# Patient Record
Sex: Female | Born: 1955 | Race: White | Hispanic: No | Marital: Married | Smoking: Never smoker
Health system: Southern US, Community
[De-identification: ages and names within clinical notes are randomized; demographics above are authoritative.]

## PROBLEM LIST (undated history)

## (undated) DIAGNOSIS — T7840XA Allergy, unspecified, initial encounter: Secondary | ICD-10-CM

## (undated) DIAGNOSIS — M419 Scoliosis, unspecified: Secondary | ICD-10-CM

## (undated) DIAGNOSIS — I10 Essential (primary) hypertension: Secondary | ICD-10-CM

## (undated) HISTORY — DX: Scoliosis, unspecified: M41.9

## (undated) HISTORY — DX: Allergy, unspecified, initial encounter: T78.40XA

## (undated) HISTORY — PX: KNEE ARTHROSCOPY: SUR90

## (undated) HISTORY — DX: Essential (primary) hypertension: I10

---

## 2005-07-11 ENCOUNTER — Ambulatory Visit: Payer: Self-pay

## 2006-12-10 ENCOUNTER — Ambulatory Visit: Payer: Self-pay

## 2008-11-30 ENCOUNTER — Ambulatory Visit: Payer: Self-pay

## 2011-09-10 ENCOUNTER — Ambulatory Visit: Payer: Self-pay

## 2012-06-15 ENCOUNTER — Ambulatory Visit: Payer: Self-pay | Admitting: General Practice

## 2012-07-28 ENCOUNTER — Encounter: Payer: Self-pay | Admitting: Neurology

## 2012-08-24 ENCOUNTER — Encounter: Payer: Self-pay | Admitting: Neurology

## 2012-09-24 ENCOUNTER — Encounter: Payer: Self-pay | Admitting: Neurology

## 2013-01-31 ENCOUNTER — Ambulatory Visit: Payer: Self-pay | Admitting: Internal Medicine

## 2013-10-10 LAB — LIPID PANEL
Cholesterol: 204 mg/dL — AB (ref 0–200)
HDL: 65 mg/dL (ref 35–70)
LDL CALC: 81 mg/dL
Triglycerides: 289 mg/dL — AB (ref 40–160)

## 2013-10-10 LAB — BASIC METABOLIC PANEL
BUN: 19 mg/dL (ref 4–21)
Creatinine: 0.8 mg/dL (ref 0.5–1.1)

## 2013-10-10 LAB — TSH: TSH: 1.1 u[IU]/mL (ref 0.41–5.90)

## 2013-10-10 LAB — CBC AND DIFFERENTIAL: Hemoglobin: 14.2 g/dL (ref 12.0–16.0)

## 2014-04-17 ENCOUNTER — Ambulatory Visit: Payer: Self-pay | Admitting: Gastroenterology

## 2014-04-17 LAB — HM COLONOSCOPY

## 2014-05-26 ENCOUNTER — Ambulatory Visit
Admit: 2014-05-26 | Disposition: A | Discharge status: Home / Self Care | Payer: Self-pay | Attending: Internal Medicine | Admitting: Internal Medicine

## 2014-05-26 LAB — HM MAMMOGRAPHY

## 2014-06-07 ENCOUNTER — Ambulatory Visit
Admit: 2014-06-07 | Disposition: A | Discharge status: Home / Self Care | Payer: Self-pay | Attending: Internal Medicine | Admitting: Internal Medicine

## 2015-02-03 ENCOUNTER — Encounter: Payer: Self-pay | Admitting: Internal Medicine

## 2015-02-03 ENCOUNTER — Other Ambulatory Visit: Payer: Self-pay | Admitting: Internal Medicine

## 2015-02-03 DIAGNOSIS — K219 Gastro-esophageal reflux disease without esophagitis: Secondary | ICD-10-CM | POA: Insufficient documentation

## 2015-02-03 DIAGNOSIS — I10 Essential (primary) hypertension: Secondary | ICD-10-CM | POA: Insufficient documentation

## 2015-02-03 DIAGNOSIS — M4802 Spinal stenosis, cervical region: Secondary | ICD-10-CM | POA: Insufficient documentation

## 2015-02-03 DIAGNOSIS — G250 Essential tremor: Secondary | ICD-10-CM | POA: Insufficient documentation

## 2015-02-03 DIAGNOSIS — E785 Hyperlipidemia, unspecified: Secondary | ICD-10-CM | POA: Insufficient documentation

## 2015-06-28 ENCOUNTER — Other Ambulatory Visit: Payer: Self-pay | Admitting: Internal Medicine

## 2015-07-06 NOTE — Telephone Encounter (Signed)
pts gonna call back to make this appt

## 2015-10-05 ENCOUNTER — Ambulatory Visit (INDEPENDENT_AMBULATORY_CARE_PROVIDER_SITE_OTHER): Payer: 59 | Admitting: Internal Medicine

## 2015-10-05 ENCOUNTER — Encounter: Payer: Self-pay | Admitting: Internal Medicine

## 2015-10-05 VITALS — BP 122/78 | HR 74 | Resp 16 | Ht 65.0 in | Wt 121.0 lb

## 2015-10-05 DIAGNOSIS — Z1239 Encounter for other screening for malignant neoplasm of breast: Secondary | ICD-10-CM | POA: Diagnosis not present

## 2015-10-05 DIAGNOSIS — I10 Essential (primary) hypertension: Secondary | ICD-10-CM

## 2015-10-05 DIAGNOSIS — M25551 Pain in right hip: Secondary | ICD-10-CM | POA: Diagnosis not present

## 2015-10-05 MED ORDER — IRBESARTAN 150 MG PO TABS
150.0000 mg | ORAL_TABLET | Freq: Every day | ORAL | 3 refills | Status: DC
Start: 2015-10-05 — End: 2016-10-15

## 2015-10-05 NOTE — Progress Notes (Signed)
Date:  10/05/2015   Name:  Wanda Moody   DOB:  May 10, 1955   MRN:  [id number]   Chief Complaint: Hypertension and Back Pain Hypertension  This is a chronic problem. The current episode started more than 1 year ago. The problem is unchanged. The problem is controlled. Pertinent negatives include no chest pain, palpitations or shortness of breath.  Back Pain  This is a chronic problem. The current episode started more than 1 year ago. The problem occurs every several days. The problem has been gradually worsening since onset. The pain is present in the sacro-iliac. The pain radiates to the right thigh. The pain is mild. Pertinent negatives include no abdominal pain, bladder incontinence, bowel incontinence, chest pain, numbness, paresthesias, perianal numbness, tingling or weakness. Risk factors: scoliosis. She has tried chiropractic manipulation and analgesics for the symptoms. The treatment provided mild relief.   HM - due for mammogram. She denies any breast problems.  No history of breast cancer in family.  Colonoscopy done in 2016.  Pap done in 2016.   Review of Systems  Constitutional: Negative for chills, fatigue and unexpected weight change.  Respiratory: Negative for cough, chest tightness and shortness of breath.   Cardiovascular: Negative for chest pain, palpitations and leg swelling.  Gastrointestinal: Negative for abdominal pain and bowel incontinence.  Genitourinary: Negative for bladder incontinence and difficulty urinating.  Musculoskeletal: Positive for back pain and gait problem.  Skin: Negative for rash.  Neurological: Negative for dizziness, tingling, weakness, numbness and paresthesias.  Psychiatric/Behavioral: Negative for dysphoric mood and sleep disturbance.    Patient Active Problem List   Diagnosis Date Noted  . Essential (primary) hypertension 02/03/2015  . Gastro-esophageal reflux disease without esophagitis 02/03/2015  . HLD (hyperlipidemia) 02/03/2015    . Menopause 02/03/2015  . Cervical spinal stenosis 02/03/2015  . Benign essential tremor 02/03/2015    Prior to Admission medications   Medication Sig Start Date End Date Taking? Authorizing Provider  calcium carbonate (OS-CAL - DOSED IN MG OF ELEMENTAL CALCIUM) 1250 (500 Ca) MG tablet Take by mouth.   Yes Historical Provider, MD  Cholecalciferol (VITAMIN D) 2000 UNITS CAPS Take 1 capsule by mouth daily.   Yes Historical Provider, MD  estrogen, conjugated,-medroxyprogesterone (PREMPRO) 0.3-1.5 MG tablet Take 1 tablet by mouth daily. 10/10/13  Yes Historical Provider, MD  irbesartan (AVAPRO) 150 MG tablet TAKE 1 TABLET BY MOUTH DAILY 06/28/15  Yes Reubin Milan, MD  loratadine (CLARITIN) 10 MG tablet Take 10 mg by mouth daily.   Yes Historical Provider, MD  MULTIPLE VITAMIN PO Take by mouth.   Yes Historical Provider, MD  omeprazole (PRILOSEC OTC) 20 MG tablet Take 1 tablet by mouth daily.   Yes Historical Provider, MD    No Known Allergies  Past Surgical History:  Procedure Laterality Date  . KNEE ARTHROSCOPY      Social History  Substance Use Topics  . Smoking status: Never Smoker  . Smokeless tobacco: Never Used  . Alcohol use 1.8 oz/week    2 Standard drinks or equivalent, 1 Glasses of wine per week     Medication list has been reviewed and updated.   Physical Exam  Constitutional: She is oriented to person, place, and time. She appears well-developed. No distress.  HENT:  Head: Normocephalic and atraumatic.  Neck: Normal range of motion. Neck supple. Carotid bruit is not present. No thyromegaly present.  Cardiovascular: Normal rate, regular rhythm and normal heart sounds.   Pulmonary/Chest: Effort normal and breath  sounds normal. No respiratory distress.  Musculoskeletal:       Right hip: She exhibits decreased range of motion. She exhibits no tenderness.       Left hip: Normal.       Lumbar back: She exhibits tenderness. She exhibits no spasm.  Lymphadenopathy:     She has no cervical adenopathy.  Neurological: She is alert and oriented to person, place, and time. She has normal strength and normal reflexes. No sensory deficit.  Skin: Skin is warm and dry. No rash noted.  Psychiatric: She has a normal mood and affect. Her behavior is normal. Thought content normal.  Nursing note and vitals reviewed.   BP 122/78 (BP Location: Right Arm, Patient Position: Sitting, Cuff Size: Normal)   Pulse 74   Resp 16   Ht 5\' 5"  (1.651 m)   Wt 121 lb (54.9 kg)   SpO2 100%   BMI 20.14 kg/m   Assessment and Plan: 1. Essential (primary) hypertension controlled - CBC with Differential/Platelet - Comprehensive metabolic panel - TSH - irbesartan (AVAPRO) 150 MG tablet; Take 1 tablet (150 mg total) by mouth daily.  Dispense: 90 tablet; Refill: 3  2. Breast cancer screening - MM DIGITAL SCREENING BILATERAL; Future  3. Right hip pain Continue tylenol - refer  - Ambulatory referral to Orthopedic Surgery   Bari EdwardLaura Alissa Pharr, MD [facility]  10/05/2015

## 2015-10-06 LAB — TSH: TSH: 1.04 u[IU]/mL (ref 0.450–4.500)

## 2015-10-06 LAB — CBC WITH DIFFERENTIAL/PLATELET
BASOS ABS: 0 10*3/uL (ref 0.0–0.2)
BASOS: 1 %
EOS (ABSOLUTE): 0.1 10*3/uL (ref 0.0–0.4)
Eos: 3 %
HEMOGLOBIN: 13.7 g/dL (ref 11.1–15.9)
Hematocrit: 41.6 % (ref 34.0–46.6)
IMMATURE GRANS (ABS): 0 10*3/uL (ref 0.0–0.1)
IMMATURE GRANULOCYTES: 0 %
LYMPHS: 22 %
Lymphocytes Absolute: 1 10*3/uL (ref 0.7–3.1)
MCH: 31.4 pg (ref 26.6–33.0)
MCHC: 32.9 g/dL (ref 31.5–35.7)
MCV: 95 fL (ref 79–97)
MONOCYTES: 7 %
Monocytes Absolute: 0.3 10*3/uL (ref 0.1–0.9)
NEUTROS PCT: 67 %
Neutrophils Absolute: 2.9 10*3/uL (ref 1.4–7.0)
PLATELETS: 225 10*3/uL (ref 150–379)
RBC: 4.36 x10E6/uL (ref 3.77–5.28)
RDW: 13.3 % (ref 12.3–15.4)
WBC: 4.4 10*3/uL (ref 3.4–10.8)

## 2015-10-06 LAB — COMPREHENSIVE METABOLIC PANEL
ALBUMIN: 4.9 g/dL — AB (ref 3.6–4.8)
ALT: 18 IU/L (ref 0–32)
AST: 20 IU/L (ref 0–40)
Albumin/Globulin Ratio: 2.1 (ref 1.2–2.2)
Alkaline Phosphatase: 43 IU/L (ref 39–117)
BUN/Creatinine Ratio: 29 — ABNORMAL HIGH (ref 12–28)
BUN: 22 mg/dL (ref 8–27)
Bilirubin Total: 0.6 mg/dL (ref 0.0–1.2)
CALCIUM: 9.9 mg/dL (ref 8.7–10.3)
CO2: 22 mmol/L (ref 18–29)
CREATININE: 0.77 mg/dL (ref 0.57–1.00)
Chloride: 99 mmol/L (ref 96–106)
GFR, EST AFRICAN AMERICAN: 96 mL/min/{1.73_m2} (ref >59)
GFR, EST NON AFRICAN AMERICAN: 84 mL/min/{1.73_m2} (ref >59)
GLUCOSE: 78 mg/dL (ref 65–99)
Globulin, Total: 2.3 g/dL (ref 1.5–4.5)
Potassium: 4.6 mmol/L (ref 3.5–5.2)
Sodium: 139 mmol/L (ref 134–144)
TOTAL PROTEIN: 7.2 g/dL (ref 6.0–8.5)

## 2015-10-09 DIAGNOSIS — G8311 Monoplegia of lower limb affecting right dominant side: Secondary | ICD-10-CM | POA: Diagnosis not present

## 2015-10-24 DIAGNOSIS — G8311 Monoplegia of lower limb affecting right dominant side: Secondary | ICD-10-CM | POA: Diagnosis not present

## 2015-10-26 DIAGNOSIS — M5136 Other intervertebral disc degeneration, lumbar region: Secondary | ICD-10-CM | POA: Diagnosis not present

## 2015-10-31 DIAGNOSIS — M5136 Other intervertebral disc degeneration, lumbar region: Secondary | ICD-10-CM | POA: Diagnosis not present

## 2015-11-05 DIAGNOSIS — M4806 Spinal stenosis, lumbar region: Secondary | ICD-10-CM | POA: Diagnosis not present

## 2015-11-16 DIAGNOSIS — M5136 Other intervertebral disc degeneration, lumbar region: Secondary | ICD-10-CM | POA: Diagnosis not present

## 2015-12-16 DIAGNOSIS — M5136 Other intervertebral disc degeneration, lumbar region: Secondary | ICD-10-CM | POA: Diagnosis not present

## 2016-01-03 ENCOUNTER — Other Ambulatory Visit: Payer: Self-pay | Admitting: Internal Medicine

## 2016-01-03 DIAGNOSIS — Z1231 Encounter for screening mammogram for malignant neoplasm of breast: Secondary | ICD-10-CM

## 2016-01-16 DIAGNOSIS — M5136 Other intervertebral disc degeneration, lumbar region: Secondary | ICD-10-CM | POA: Diagnosis not present

## 2016-01-31 ENCOUNTER — Ambulatory Visit
Admission: RE | Admit: 2016-01-31 | Discharge: 2016-01-31 | Disposition: A | Discharge status: Home / Self Care | Payer: 59 | Source: Ambulatory Visit | Attending: Internal Medicine | Admitting: Internal Medicine

## 2016-01-31 DIAGNOSIS — Z1231 Encounter for screening mammogram for malignant neoplasm of breast: Secondary | ICD-10-CM | POA: Insufficient documentation

## 2016-02-29 ENCOUNTER — Ambulatory Visit: Payer: Self-pay | Admitting: Physician Assistant

## 2016-02-29 ENCOUNTER — Encounter: Payer: Self-pay | Admitting: Physician Assistant

## 2016-02-29 VITALS — BP 110/70 | HR 84 | Temp 97.6°F

## 2016-02-29 DIAGNOSIS — H6982 Other specified disorders of Eustachian tube, left ear: Secondary | ICD-10-CM

## 2016-02-29 MED ORDER — FLUTICASONE PROPIONATE 50 MCG/ACT NA SUSP: 2.0000 | Freq: Every day | NASAL | 6 refills | Status: DC

## 2016-02-29 MED ORDER — PREDNISONE 10 MG PO TABS: 30.0000 mg | ORAL_TABLET | Freq: Every day | ORAL | 0 refills | Status: DC

## 2016-02-29 MED ORDER — AMOXICILLIN 875 MG PO TABS: 875.0000 mg | ORAL_TABLET | Freq: Two times a day (BID) | ORAL | 0 refills | Status: DC

## 2016-02-29 NOTE — Progress Notes (Signed)
S:  C/o swollen gland under left ear for 4 days, no drainage from ears, no fever/chills, no cough or congestion, some sinus pressure, remainder ros neg Using otc meds without relief  O:  Vitals wnl, nad, tms dull b/l, worse on left side, nasal mucosa swollen, throat wnl, neck supple no lymph, lungs c t a, cv rrr, neuro intact  A: acute eustachean tube dysfunction  P: flonase,  prednisone 30mg  qd x 3d, return if not improving in 3 to 5 days, return earlier if worsening, amoxil if not better in 2 - 3 days

## 2016-04-17 ENCOUNTER — Encounter: Payer: Self-pay | Admitting: Internal Medicine

## 2016-04-17 ENCOUNTER — Ambulatory Visit (INDEPENDENT_AMBULATORY_CARE_PROVIDER_SITE_OTHER): Payer: 59 | Admitting: Internal Medicine

## 2016-04-17 VITALS — BP 118/64 | HR 67 | Ht 65.0 in | Wt 122.8 lb

## 2016-04-17 DIAGNOSIS — I499 Cardiac arrhythmia, unspecified: Secondary | ICD-10-CM | POA: Diagnosis not present

## 2016-04-17 NOTE — Progress Notes (Signed)
Date:  04/17/2016   Name:  Wanda Moody   DOB:  02-22-1956   MRN:  [id number]   Chief Complaint: irregular heartbeat (lasted 3 days with shortness of breath. started 5 days ago.) Palpitations   This is a new problem. The current episode started in the past 7 days. The problem occurs 2 to 4 times per day. The problem has been gradually improving. On average, each episode lasts 5 seconds. Nothing aggravates the symptoms. Associated symptoms include an irregular heartbeat and shortness of breath. Pertinent negatives include no chest pain, coughing, dizziness, nausea, near-syncope or vomiting. She has tried nothing for the symptoms.    Review of Systems  Constitutional: Positive for fatigue. Negative for chills and unexpected weight change.  Eyes: Negative for visual disturbance.  Respiratory: Positive for shortness of breath. Negative for cough, chest tightness and wheezing.   Cardiovascular: Positive for palpitations. Negative for chest pain, leg swelling and near-syncope.  Gastrointestinal: Negative for abdominal pain, nausea and vomiting.  Endocrine: Negative for polyuria.  Neurological: Negative for dizziness, syncope and headaches.    Patient Active Problem List   Diagnosis Date Noted  . Right hip pain 10/05/2015  . Essential (primary) hypertension 02/03/2015  . Gastro-esophageal reflux disease without esophagitis 02/03/2015  . HLD (hyperlipidemia) 02/03/2015  . Menopause 02/03/2015  . Cervical spinal stenosis 02/03/2015  . Benign essential tremor 02/03/2015    Prior to Admission medications   Medication Sig Start Date End Date Taking? Authorizing Provider  calcium carbonate (OS-CAL - DOSED IN MG OF ELEMENTAL CALCIUM) 1250 (500 Ca) MG tablet Take by mouth.   Yes Historical Provider, MD  Cholecalciferol (VITAMIN D) 2000 UNITS CAPS Take 1 capsule by mouth daily.   Yes Historical Provider, MD  fluticasone (FLONASE) 50 MCG/ACT nasal spray Place 2 sprays into both nostrils  daily. 02/29/16  Yes Faythe Ghee, PA-C  loratadine (CLARITIN) 10 MG tablet Take 10 mg by mouth daily.   Yes Historical Provider, MD  valsartan (DIOVAN) 160 MG tablet Take by mouth.   Yes Historical Provider, MD  amoxicillin (AMOXIL) 875 MG tablet Take 1 tablet (875 mg total) by mouth 2 (two) times daily. Patient not taking: Reported on 04/17/2016 02/29/16   Faythe Ghee, PA-C  estrogen, conjugated,-medroxyprogesterone (PREMPRO) 0.3-1.5 MG tablet Take 1 tablet by mouth daily. 10/10/13   Historical Provider, MD  irbesartan (AVAPRO) 150 MG tablet Take 1 tablet (150 mg total) by mouth daily. Patient not taking: Reported on 02/29/2016 10/05/15   Reubin Milan, MD  MULTIPLE VITAMIN PO Take by mouth.    Historical Provider, MD  Omega-3 1000 MG CAPS Take 1,000 mg by mouth daily.    Historical Provider, MD  omeprazole (PRILOSEC OTC) 20 MG tablet Take 1 tablet by mouth daily.    Historical Provider, MD  predniSONE (DELTASONE) 10 MG tablet Take 3 tablets (30 mg total) by mouth daily with breakfast. Patient not taking: Reported on 04/17/2016 02/29/16   Faythe Ghee, PA-C    No Known Allergies  Past Surgical History:  Procedure Laterality Date  . KNEE ARTHROSCOPY      Social History  Substance Use Topics  . Smoking status: Never Smoker  . Smokeless tobacco: Never Used  . Alcohol use 1.8 oz/week    2 Standard drinks or equivalent, 1 Glasses of wine per week     Medication list has been reviewed and updated.   Physical Exam  Constitutional: She is oriented to person, place, and time. She appears  well-developed. No distress.  HENT:  Head: Normocephalic and atraumatic.  Neck: Normal range of motion. Neck supple. No tracheal tenderness present. Carotid bruit is not present.  Cardiovascular: Normal rate, regular rhythm and normal heart sounds.   Occasional extrasystoles are present.  Pulmonary/Chest: Effort normal and breath sounds normal. No respiratory distress. She has no wheezes. She has no  rhonchi.  Musculoskeletal: She exhibits no edema.  Neurological: She is alert and oriented to person, place, and time.  Skin: Skin is warm and dry. No rash noted.  Psychiatric: She has a normal mood and affect. Her behavior is normal. Thought content normal.  Nursing note and vitals reviewed.   BP 118/64   Pulse 67   Ht 5\' 5"  (1.651 m)   Wt 122 lb 12.8 oz (55.7 kg)   SpO2 100%   BMI 20.43 kg/m   Assessment and Plan: 1. Cardiac arrhythmia, unspecified cardiac arrhythmia type Suspect PAC/PVC but none seen on EKG Will place ZIO patch - EKG 12-Lead NSR @ 61, WNL   Bari EdwardLaura Tyshika Baldridge, MD [facility]  04/17/2016

## 2016-04-17 NOTE — Patient Instructions (Signed)
Return at 1:45 today for ZIO patch

## 2016-05-02 DIAGNOSIS — R002 Palpitations: Secondary | ICD-10-CM | POA: Diagnosis not present

## 2016-05-10 ENCOUNTER — Telehealth: Payer: Self-pay | Admitting: Internal Medicine

## 2016-05-12 ENCOUNTER — Telehealth: Payer: Self-pay

## 2016-05-12 ENCOUNTER — Encounter: Payer: Self-pay | Admitting: Internal Medicine

## 2016-05-12 NOTE — Telephone Encounter (Signed)
Called pt with Zio patch results.- Given to DelightNikki to scan into chart.

## 2016-05-12 NOTE — Telephone Encounter (Signed)
Patient notified of ZIO results.  No worrisome abnormality.  Only occasional PAC/PVC.

## 2016-06-09 ENCOUNTER — Telehealth: Payer: Self-pay

## 2016-06-09 NOTE — Telephone Encounter (Signed)
Patient has 2 medical eval charges as well as med exam EKG and was told to leave and come back after lunch so that they could see if she was approved for zio patch and was told that she was but then got bill stating she is out of network for zio. I do have a number that she can call for zio and they will help cover the amount but I have no idea about the rest of the bill for this DOS and she was billed over 900 when she never even has co pay here.

## 2016-06-09 NOTE — Telephone Encounter (Signed)
This was applied to deductible

## 2016-06-10 NOTE — Telephone Encounter (Signed)
LMTCB

## 2016-06-11 NOTE — Telephone Encounter (Signed)
Patient said she has NEVER had to pay office charges or deductible. She said she met 200.00 on co insurance and is very confused so I gave her Billing number.

## 2016-10-15 ENCOUNTER — Other Ambulatory Visit: Payer: Self-pay | Admitting: Internal Medicine

## 2016-10-15 DIAGNOSIS — I10 Essential (primary) hypertension: Secondary | ICD-10-CM

## 2016-10-15 NOTE — Telephone Encounter (Signed)
LVM to call back to schedule appt

## 2016-12-04 ENCOUNTER — Ambulatory Visit (INDEPENDENT_AMBULATORY_CARE_PROVIDER_SITE_OTHER): Payer: 59 | Admitting: Internal Medicine

## 2016-12-04 ENCOUNTER — Encounter: Payer: Self-pay | Admitting: Internal Medicine

## 2016-12-04 VITALS — BP 122/68 | HR 72 | Ht 65.0 in | Wt 124.0 lb

## 2016-12-04 DIAGNOSIS — Z1231 Encounter for screening mammogram for malignant neoplasm of breast: Secondary | ICD-10-CM

## 2016-12-04 DIAGNOSIS — E782 Mixed hyperlipidemia: Secondary | ICD-10-CM

## 2016-12-04 DIAGNOSIS — M1811 Unilateral primary osteoarthritis of first carpometacarpal joint, right hand: Secondary | ICD-10-CM | POA: Diagnosis not present

## 2016-12-04 DIAGNOSIS — M180 Bilateral primary osteoarthritis of first carpometacarpal joints: Secondary | ICD-10-CM

## 2016-12-04 DIAGNOSIS — I10 Essential (primary) hypertension: Secondary | ICD-10-CM

## 2016-12-04 DIAGNOSIS — M1812 Unilateral primary osteoarthritis of first carpometacarpal joint, left hand: Secondary | ICD-10-CM

## 2016-12-04 DIAGNOSIS — K219 Gastro-esophageal reflux disease without esophagitis: Secondary | ICD-10-CM

## 2016-12-04 DIAGNOSIS — Z1239 Encounter for other screening for malignant neoplasm of breast: Secondary | ICD-10-CM

## 2016-12-04 MED ORDER — IRBESARTAN 150 MG PO TABS: 150.0000 mg | ORAL_TABLET | Freq: Every day | ORAL | 3 refills | Status: DC

## 2016-12-04 NOTE — Progress Notes (Signed)
Date:  12/04/2016   Name:  Wanda Moody   DOB:  Aug 24, 1955   MRN:  [id number]   Chief Complaint: Hypertension Hypertension  This is a chronic problem. The problem is controlled. Pertinent negatives include no chest pain, headaches or shortness of breath. Past treatments include angiotensin blockers. The current treatment provides significant improvement.  Gastroesophageal Reflux  She reports no abdominal pain or no chest pain. This is a recurrent problem. The problem occurs occasionally. Pertinent negatives include no fatigue.  Hyperlipidemia  This is a chronic problem. Recent lipid tests were reviewed and are variable. Pertinent negatives include no chest pain or shortness of breath. Current antihyperlipidemic treatment includes diet change and exercise.  Thumb arthritis - stiffness and pain in both thumb joints. Is able to work and do what she needs to but is bothered on a daily basis.There is no numbess or significant loss of strength.She uses Advil as needed which has helped.    Review of Systems  Constitutional: Negative for chills, fatigue and unexpected weight change.  Respiratory: Negative for chest tightness and shortness of breath.   Cardiovascular: Negative for chest pain.  Gastrointestinal: Negative for abdominal pain.  Musculoskeletal: Positive for arthralgias (in both thumbs).  Skin: Negative for rash.  Neurological: Negative for dizziness, syncope, weakness and headaches.  Psychiatric/Behavioral: Negative for dysphoric mood and sleep disturbance. The patient is not nervous/anxious.     Patient Active Problem List   Diagnosis Date Noted  . Osteoarthritis of both thumbs 12/04/2016  . Right hip pain 10/05/2015  . Essential (primary) hypertension 02/03/2015  . Gastro-esophageal reflux disease without esophagitis 02/03/2015  . HLD (hyperlipidemia) 02/03/2015  . Menopause 02/03/2015  . Cervical spinal stenosis 02/03/2015  . Benign essential tremor 02/03/2015     Prior to Admission medications   Medication Sig Start Date End Date Taking? Authorizing Provider  calcium carbonate (OS-CAL - DOSED IN MG OF ELEMENTAL CALCIUM) 1250 (500 Ca) MG tablet Take by mouth.   Yes [provider]  Cholecalciferol (VITAMIN D) 2000 UNITS CAPS Take 1 capsule by mouth daily.   Yes [provider]  irbesartan (AVAPRO) 150 MG tablet TAKE 1 TABLET BY MOUTH DAILY 10/15/16  Yes Reubin Milan, MD  loratadine (CLARITIN) 10 MG tablet Take 10 mg by mouth daily.   Yes [provider]  MULTIPLE VITAMIN PO Take by mouth.   Yes [provider]  Omega-3 1000 MG CAPS Take 1,000 mg by mouth daily.   Yes [provider]    No Known Allergies  Past Surgical History:  Procedure Laterality Date  . KNEE ARTHROSCOPY      Social History  Substance Use Topics  . Smoking status: Never Smoker  . Smokeless tobacco: Never Used  . Alcohol use 1.8 oz/week    2 Standard drinks or equivalent, 1 Glasses of wine per week     Medication list has been reviewed and updated.  PHQ 2/9 Scores 12/04/2016  PHQ - 2 Score 3  PHQ- 9 Score 6    Physical Exam  Constitutional: She is oriented to person, place, and time. She appears well-developed. No distress.  HENT:  Head: Normocephalic and atraumatic.  Neck: Normal range of motion. Neck supple. Carotid bruit is not present. No thyromegaly present.  Cardiovascular: Normal rate, regular rhythm and normal heart sounds.   Pulmonary/Chest: Effort normal and breath sounds normal. No respiratory distress. She has no wheezes.  Musculoskeletal: Normal range of motion.  OA changes in both 1st MCP  joints with thenar atrophy  Neurological: She is alert and oriented to person, place, and time.  Skin: Skin is warm and dry. No rash noted.  Psychiatric: She has a normal mood and affect. Her speech is normal and behavior is normal. Thought content normal.  Nursing note and vitals reviewed.   BP 122/68 (BP  Location: Left Arm, Patient Position: Sitting, Cuff Size: Normal)   Pulse 72   Ht  (1.651 m)   Wt 124 lb (56.2 kg)   SpO2 98%   BMI 20.63 kg/m   Assessment and Plan: 1. Essential (primary) hypertension controlled - Comprehensive metabolic panel - TSH - irbesartan (AVAPRO) 150 MG tablet; Take 1 tablet (150 mg total) by mouth daily.  Dispense: 90 tablet; Refill: 3  2. Gastro-esophageal reflux disease without esophagitis Stable on no medication - CBC with Differential/Platelet  3. Mixed hyperlipidemia Check labs and advise - Lipid panel  4. Breast cancer screening - MM DIGITAL SCREENING BILATERAL; Future  5. Osteoarthritis of both thumbs Use topical rubs or Advil/tylenol as needed   Meds ordered this encounter  Medications  . irbesartan (AVAPRO) 150 MG tablet    Sig: Take 1 tablet (150 mg total) by mouth daily.    Dispense:  90 tablet    Refill:  3    Partially dictated using Animal nutritionist. Any errors are unintentional.  Bari Edward, MD [facility]  12/04/2016

## 2016-12-05 LAB — CBC WITH DIFFERENTIAL/PLATELET
BASOS ABS: 0 10*3/uL (ref 0.0–0.2)
Basos: 1 %
EOS (ABSOLUTE): 0.2 10*3/uL (ref 0.0–0.4)
Eos: 3 %
HEMATOCRIT: 40.9 % (ref 34.0–46.6)
HEMOGLOBIN: 13.6 g/dL (ref 11.1–15.9)
Immature Grans (Abs): 0 10*3/uL (ref 0.0–0.1)
Immature Granulocytes: 0 %
LYMPHS ABS: 1.2 10*3/uL (ref 0.7–3.1)
Lymphs: 27 %
MCH: 31.5 pg (ref 26.6–33.0)
MCHC: 33.3 g/dL (ref 31.5–35.7)
MCV: 95 fL (ref 79–97)
MONOS ABS: 0.3 10*3/uL (ref 0.1–0.9)
Monocytes: 7 %
NEUTROS ABS: 2.8 10*3/uL (ref 1.4–7.0)
Neutrophils: 62 %
Platelets: 249 10*3/uL (ref 150–379)
RBC: 4.32 x10E6/uL (ref 3.77–5.28)
RDW: 13.3 % (ref 12.3–15.4)
WBC: 4.5 10*3/uL (ref 3.4–10.8)

## 2016-12-05 LAB — COMPREHENSIVE METABOLIC PANEL
ALBUMIN: 4.9 g/dL — AB (ref 3.6–4.8)
ALK PHOS: 65 IU/L (ref 39–117)
ALT: 19 IU/L (ref 0–32)
AST: 22 IU/L (ref 0–40)
Albumin/Globulin Ratio: 2 (ref 1.2–2.2)
BUN / CREAT RATIO: 24 (ref 12–28)
BUN: 16 mg/dL (ref 8–27)
Bilirubin Total: 0.7 mg/dL (ref 0.0–1.2)
CO2: 25 mmol/L (ref 20–29)
CREATININE: 0.68 mg/dL (ref 0.57–1.00)
Calcium: 10.1 mg/dL (ref 8.7–10.3)
Chloride: 101 mmol/L (ref 96–106)
GFR calc Af Amer: 108 mL/min/{1.73_m2} (ref >59)
GFR calc non Af Amer: 94 mL/min/{1.73_m2} (ref >59)
GLOBULIN, TOTAL: 2.5 g/dL (ref 1.5–4.5)
Glucose: 81 mg/dL (ref 65–99)
POTASSIUM: 4.6 mmol/L (ref 3.5–5.2)
SODIUM: 140 mmol/L (ref 134–144)
Total Protein: 7.4 g/dL (ref 6.0–8.5)

## 2016-12-05 LAB — LIPID PANEL
CHOLESTEROL TOTAL: 253 mg/dL — AB (ref 100–199)
Chol/HDL Ratio: 3.7 ratio (ref 0.0–4.4)
HDL: 68 mg/dL (ref >39)
LDL Calculated: 146 mg/dL — ABNORMAL HIGH (ref 0–99)
TRIGLYCERIDES: 196 mg/dL — AB (ref 0–149)
VLDL Cholesterol Cal: 39 mg/dL (ref 5–40)

## 2016-12-05 LAB — TSH: TSH: 1.33 u[IU]/mL (ref 0.450–4.500)

## 2017-03-18 ENCOUNTER — Ambulatory Visit
Admission: RE | Admit: 2017-03-18 | Discharge: 2017-03-18 | Disposition: A | Discharge status: Home / Self Care | Payer: 59 | Source: Ambulatory Visit | Attending: Internal Medicine | Admitting: Internal Medicine

## 2017-03-18 DIAGNOSIS — Z1231 Encounter for screening mammogram for malignant neoplasm of breast: Secondary | ICD-10-CM | POA: Insufficient documentation

## 2017-03-18 DIAGNOSIS — Z1239 Encounter for other screening for malignant neoplasm of breast: Secondary | ICD-10-CM

## 2017-06-04 ENCOUNTER — Ambulatory Visit (INDEPENDENT_AMBULATORY_CARE_PROVIDER_SITE_OTHER): Payer: 59 | Admitting: Internal Medicine

## 2017-06-04 ENCOUNTER — Encounter: Payer: Self-pay | Admitting: Internal Medicine

## 2017-06-04 VITALS — BP 126/78 | HR 64 | Ht 65.0 in | Wt 123.0 lb

## 2017-06-04 DIAGNOSIS — K219 Gastro-esophageal reflux disease without esophagitis: Secondary | ICD-10-CM

## 2017-06-04 DIAGNOSIS — Z0001 Encounter for general adult medical examination with abnormal findings: Secondary | ICD-10-CM

## 2017-06-04 DIAGNOSIS — N952 Postmenopausal atrophic vaginitis: Secondary | ICD-10-CM | POA: Diagnosis not present

## 2017-06-04 DIAGNOSIS — Z0000 Encounter for general adult medical examination without abnormal findings: Secondary | ICD-10-CM

## 2017-06-04 DIAGNOSIS — I10 Essential (primary) hypertension: Secondary | ICD-10-CM

## 2017-06-04 DIAGNOSIS — N951 Menopausal and female climacteric states: Secondary | ICD-10-CM | POA: Insufficient documentation

## 2017-06-04 MED ORDER — ESTRADIOL 0.1 MG/GM VA CREA: 1.0000 | TOPICAL_CREAM | VAGINAL | 12 refills | Status: DC

## 2017-06-04 NOTE — Progress Notes (Signed)
Date:  06/04/2017   Name:  Wanda Primeamela K Hargraves   DOB:  May 17, 1955   MRN:  [id number]   Chief Complaint: [place] and vaginal dryness (States hot flashes are getting worse and vaginal wall is not good. States used to be on Prempro and wanted to know if get back on it. ) Wanda Moody is a 62 y.o. female who presents today for her Complete Annual Exam. She feels well. She reports exercising regularly. She reports she is sleeping well. She denies breast complaints - mammogram was done in January.  Hypertension  This is a chronic problem. The problem is unchanged. The problem is controlled. Pertinent negatives include no chest pain, headaches, palpitations or shortness of breath. Past treatments include angiotensin blockers. The current treatment provides significant improvement.  Gastroesophageal Reflux  She reports no abdominal pain, no chest pain, no coughing or no wheezing. This is a recurrent problem. The problem occurs rarely. Pertinent negatives include no fatigue. She has tried a PPI (but stopped taking it daily) for the symptoms. The treatment provided significant relief.   Vaginal atrophy - she is having vaginal dryness and some discomfort with intercourse.  No vaginal bleeding.  She was on premarin vag cream at one time, then on Prempro.  She stopped it for unclear reasons, probably just ran out of refills.  Now she thinks she could use something again.  She recently had her GYN exam and all was normal.  Lab Results  Component Value Date   CHOL 253 (H) 12/04/2016   HDL 68 12/04/2016   LDLCALC 146 (H) 12/04/2016   TRIG 196 (H) 12/04/2016   CHOLHDL 3.7 12/04/2016   Lab Results  Component Value Date   CREATININE 0.68 12/04/2016   BUN 16 12/04/2016   NA 140 12/04/2016   K 4.6 12/04/2016   CL 101 12/04/2016   CO2 25 12/04/2016   Lab Results  Component Value Date   WBC 4.5 12/04/2016   HGB 13.6 12/04/2016   HCT 40.9 12/04/2016   MCV 95 12/04/2016   PLT 249 12/04/2016     Review of Systems  Constitutional: Negative for chills, fatigue and fever.  HENT: Negative for congestion, hearing loss, tinnitus, trouble swallowing and voice change.   Eyes: Negative for visual disturbance.  Respiratory: Negative for cough, chest tightness, shortness of breath and wheezing.   Cardiovascular: Negative for chest pain, palpitations and leg swelling.  Gastrointestinal: Negative for abdominal pain, constipation, diarrhea and vomiting.  Endocrine: Negative for polydipsia and polyuria.  Genitourinary: Negative for dysuria, frequency, genital sores, vaginal bleeding and vaginal discharge.  Musculoskeletal: Negative for arthralgias, gait problem and joint swelling.  Skin: Negative for color change and rash.  Neurological: Negative for dizziness, tremors, light-headedness and headaches.  Hematological: Negative for adenopathy. Does not bruise/bleed easily.  Psychiatric/Behavioral: Negative for dysphoric mood and sleep disturbance. The patient is not nervous/anxious.     Patient Active Problem List   Diagnosis Date Noted  . Menopausal syndrome 06/04/2017  . Osteoarthritis of both thumbs 12/04/2016  . Right hip pain 10/05/2015  . Essential (primary) hypertension 02/03/2015  . Gastro-esophageal reflux disease without esophagitis 02/03/2015  . HLD (hyperlipidemia) 02/03/2015  . Menopause 02/03/2015  . Cervical spinal stenosis 02/03/2015  . Benign essential tremor 02/03/2015    Prior to Admission medications   Medication Sig Start Date End Date Taking? Authorizing Provider  calcium carbonate (OS-CAL - DOSED IN MG OF ELEMENTAL CALCIUM) 1250 (500 Ca) MG tablet  Take by mouth.   Yes [provider]  Cholecalciferol (VITAMIN D) 2000 UNITS CAPS Take 1 capsule by mouth daily.   Yes [provider]  irbesartan (AVAPRO) 150 MG tablet Take 1 tablet (150 mg total) by mouth daily. 12/04/16  Yes Reubin Milan, MD  loratadine (CLARITIN) 10 MG  tablet Take 10 mg by mouth daily.   Yes [provider]  MULTIPLE VITAMIN PO Take by mouth.   Yes [provider]  Omega-3 1000 MG CAPS Take 1,000 mg by mouth daily.   Yes [provider]    No Known Allergies  Past Surgical History:  Procedure Laterality Date  . KNEE ARTHROSCOPY      Social History   Tobacco Use  . Smoking status: Never Smoker  . Smokeless tobacco: Never Used  Substance Use Topics  . Alcohol use: Yes    Alcohol/week: 1.8 oz    Types: 2 Standard drinks or equivalent, 1 Glasses of wine per week  . Drug use: Not on file     Medication list has been reviewed and updated.  PHQ 2/9 Scores 06/04/2017 12/04/2016  PHQ - 2 Score 0 3  PHQ- 9 Score - 6    Physical Exam  Constitutional: She is oriented to person, place, and time. She appears well-developed and well-nourished. No distress.  HENT:  Head: Normocephalic and atraumatic.  Right Ear: Tympanic membrane and ear canal normal.  Left Ear: Tympanic membrane and ear canal normal.  Nose: Right sinus exhibits no maxillary sinus tenderness. Left sinus exhibits no maxillary sinus tenderness.  Mouth/Throat: Uvula is midline and oropharynx is clear and moist.  Eyes: Conjunctivae and EOM are normal. Right eye exhibits no discharge. Left eye exhibits no discharge. No scleral icterus.  Neck: Normal range of motion. Carotid bruit is not present. No erythema present. No thyromegaly present.  Cardiovascular: Normal rate, regular rhythm, normal heart sounds and normal pulses.  Pulmonary/Chest: Effort normal. No respiratory distress. She has no wheezes.  Abdominal: Soft. Bowel sounds are normal. There is no hepatosplenomegaly. There is no tenderness. There is no CVA tenderness.  Musculoskeletal: Normal range of motion.  Lymphadenopathy:    She has no cervical adenopathy.    She has no axillary adenopathy.  Neurological: She is alert and oriented to person, place, and time. She has normal reflexes.  No cranial nerve deficit or sensory deficit.  Skin: Skin is warm, dry and intact. No rash noted.  Psychiatric: She has a normal mood and affect. Her speech is normal and behavior is normal. Thought content normal.  Nursing note and vitals reviewed.   BP 126/78   Pulse 64   Ht 5\' 5"  (1.651 m)   Wt 123 lb (55.8 kg)   SpO2 100%   BMI 20.47 kg/m   Assessment and Plan: 1. Annual physical exam Normal exam Continue exercise and healthy diet Weight is at goal  2. Vaginal atrophy Begin vaginal estrogen replacement - estradiol (ESTRACE) 0.1 MG/GM vaginal cream; Place 1 Applicatorful vaginally 3 (three) times a week.  Dispense: 42.5 g; Refill: 12  3. Essential (primary) hypertension Controlled Labs done last visit were all normal.  4. Gastro-esophageal reflux disease without esophagitis No current sx requiring medication   Meds ordered this encounter  Medications  . estradiol (ESTRACE) 0.1 MG/GM vaginal cream    Sig: Place 1 Applicatorful vaginally 3 (three) times a week.    Dispense:  42.5 g    Refill:  12    Partially dictated using  Animal nutritionist. Any errors are unintentional.  Bari Edward, MD [facility]  06/04/2017

## 2017-06-04 NOTE — Patient Instructions (Signed)

## 2017-07-22 ENCOUNTER — Telehealth: Payer: 59 | Admitting: Nurse Practitioner

## 2017-07-22 DIAGNOSIS — R05 Cough: Secondary | ICD-10-CM

## 2017-07-22 DIAGNOSIS — R059 Cough, unspecified: Secondary | ICD-10-CM

## 2017-07-22 NOTE — Progress Notes (Signed)
We are sorry that you are not feeling well.  Here is how we plan to help!  Based on your presentation I believe you most likely have A cough due to a virus.  This is called viral bronchitis and is best treated by rest, plenty of fluids and control of the cough.  You may use Ibuprofen or Tylenol as directed to help your symptoms.     In addition you may use A non-prescription cough medication called Robitussin DAC. Take 2 teaspoons every 8 hours or Delsym: take 2 teaspoons every 12 hours.  Providers prescribe antibiotics to treat infections caused by bacteria. Antibiotics are very powerful in treating bacterial infections when they are used properly. To maintain their effectiveness, they should be used only when necessary. Overuse of antibiotics has resulted in the development of superbugs that are resistant to treatment!    After careful review of your answers, I would not recommend an antibiotic for your condition.  Antibiotics are not effective against viruses and therefore should not be used to treat them. Common examples of infections caused by viruses include colds and flu     From your responses in the eVisit questionnaire you describe inflammation in the upper respiratory tract which is causing a significant cough.  This is commonly called Bronchitis and has four common causes:    Allergies  Viral Infections  Acid Reflux  Bacterial Infection Allergies, viruses and acid reflux are treated by controlling symptoms or eliminating the cause. An example might be a cough caused by taking certain blood pressure medications. You stop the cough by changing the medication. Another example might be a cough caused by acid reflux. Controlling the reflux helps control the cough.  USE OF BRONCHODILATOR ("RESCUE") INHALERS: There is a risk from using your bronchodilator too frequently.  The risk is that over-reliance on a medication which only relaxes the muscles surrounding the breathing tubes can  reduce the effectiveness of medications prescribed to reduce swelling and congestion of the tubes themselves.  Although you feel brief relief from the bronchodilator inhaler, your asthma may actually be worsening with the tubes becoming more swollen and filled with mucus.  This can delay other crucial treatments, such as oral steroid medications. If you need to use a bronchodilator inhaler daily, several times per day, you should discuss this with your provider.  There are probably better treatments that could be used to keep your asthma under control.     HOME CARE . Only take medications as instructed by your medical team. . Complete the entire course of an antibiotic. . Drink plenty of fluids and get plenty of rest. . Avoid close contacts especially the very young and the elderly . Cover your mouth if you cough or cough into your sleeve. . Always remember to wash your hands . A steam or ultrasonic humidifier can help congestion.   GET HELP RIGHT AWAY IF: . You develop worsening fever. . You become short of breath . You cough up blood. . Your symptoms persist after you have completed your treatment plan MAKE SURE YOU   Understand these instructions.  Will watch your condition.  Will get help right away if you are not doing well or get worse.  Your e-visit answers were reviewed by a board certified advanced clinical practitioner to complete your personal care plan.  Depending on the condition, your plan could have included both over the counter or prescription medications. If there is a problem please reply  once you have received  a response from your provider. Your safety is important to [place].  If you have drug allergies check your prescription carefully.    You can use MyChart to ask questions about today's visit, request a non-urgent call back, or ask for a work or school excuse for 24 hours related to this e-Visit. If it has been greater than 24 hours you will need to follow up with  your provider, or enter a new e-Visit to address those concerns. You will get an e-mail in the next two days asking about your experience.  I hope that your e-visit has been valuable and will speed your recovery. Thank you for using e-visits.  2

## 2017-11-19 DIAGNOSIS — L570 Actinic keratosis: Secondary | ICD-10-CM | POA: Diagnosis not present

## 2017-11-19 DIAGNOSIS — H61002 Unspecified perichondritis of left external ear: Secondary | ICD-10-CM | POA: Diagnosis not present

## 2018-01-19 ENCOUNTER — Other Ambulatory Visit: Payer: Self-pay | Admitting: Internal Medicine

## 2018-01-19 DIAGNOSIS — I10 Essential (primary) hypertension: Secondary | ICD-10-CM

## 2018-01-26 ENCOUNTER — Ambulatory Visit: Payer: 59 | Admitting: Internal Medicine

## 2018-03-17 DIAGNOSIS — M5441 Lumbago with sciatica, right side: Secondary | ICD-10-CM | POA: Diagnosis not present

## 2018-03-17 DIAGNOSIS — M419 Scoliosis, unspecified: Secondary | ICD-10-CM | POA: Diagnosis not present

## 2018-03-17 DIAGNOSIS — R2689 Other abnormalities of gait and mobility: Secondary | ICD-10-CM | POA: Diagnosis not present

## 2018-03-17 DIAGNOSIS — G8929 Other chronic pain: Secondary | ICD-10-CM | POA: Diagnosis not present

## 2018-04-05 DIAGNOSIS — G8929 Other chronic pain: Secondary | ICD-10-CM | POA: Diagnosis not present

## 2018-04-05 DIAGNOSIS — R2689 Other abnormalities of gait and mobility: Secondary | ICD-10-CM | POA: Diagnosis not present

## 2018-04-05 DIAGNOSIS — M5441 Lumbago with sciatica, right side: Secondary | ICD-10-CM | POA: Diagnosis not present

## 2018-04-05 DIAGNOSIS — M419 Scoliosis, unspecified: Secondary | ICD-10-CM | POA: Diagnosis not present

## 2018-04-05 DIAGNOSIS — M47816 Spondylosis without myelopathy or radiculopathy, lumbar region: Secondary | ICD-10-CM | POA: Diagnosis not present

## 2018-04-13 DIAGNOSIS — M5416 Radiculopathy, lumbar region: Secondary | ICD-10-CM | POA: Diagnosis not present

## 2018-04-13 DIAGNOSIS — G959 Disease of spinal cord, unspecified: Secondary | ICD-10-CM | POA: Diagnosis not present

## 2018-04-13 DIAGNOSIS — R1031 Right lower quadrant pain: Secondary | ICD-10-CM | POA: Diagnosis not present

## 2018-04-13 DIAGNOSIS — M4316 Spondylolisthesis, lumbar region: Secondary | ICD-10-CM | POA: Diagnosis not present

## 2018-04-25 DIAGNOSIS — I10 Essential (primary) hypertension: Secondary | ICD-10-CM | POA: Diagnosis not present

## 2018-04-25 DIAGNOSIS — I4891 Unspecified atrial fibrillation: Secondary | ICD-10-CM | POA: Diagnosis not present

## 2018-05-04 ENCOUNTER — Other Ambulatory Visit: Payer: Self-pay | Admitting: Internal Medicine

## 2018-05-04 DIAGNOSIS — Z1231 Encounter for screening mammogram for malignant neoplasm of breast: Secondary | ICD-10-CM

## 2018-05-07 DIAGNOSIS — Z5189 Encounter for other specified aftercare: Secondary | ICD-10-CM | POA: Diagnosis not present

## 2018-05-07 DIAGNOSIS — M5116 Intervertebral disc disorders with radiculopathy, lumbar region: Secondary | ICD-10-CM | POA: Diagnosis not present

## 2018-05-12 ENCOUNTER — Ambulatory Visit: Payer: Self-pay

## 2018-05-13 DIAGNOSIS — M5116 Intervertebral disc disorders with radiculopathy, lumbar region: Secondary | ICD-10-CM | POA: Diagnosis not present

## 2018-05-13 DIAGNOSIS — Z5189 Encounter for other specified aftercare: Secondary | ICD-10-CM | POA: Diagnosis not present

## 2018-05-17 ENCOUNTER — Ambulatory Visit: Payer: Self-pay

## 2018-06-07 ENCOUNTER — Encounter: Payer: 59 | Admitting: Internal Medicine

## 2018-06-10 ENCOUNTER — Ambulatory Visit (INDEPENDENT_AMBULATORY_CARE_PROVIDER_SITE_OTHER): Payer: 59 | Admitting: Internal Medicine

## 2018-06-10 ENCOUNTER — Encounter: Payer: Self-pay | Admitting: Internal Medicine

## 2018-06-10 ENCOUNTER — Other Ambulatory Visit: Payer: Self-pay

## 2018-06-10 VITALS — BP 128/70 | HR 98 | Ht 65.0 in | Wt 127.0 lb

## 2018-06-10 DIAGNOSIS — I4891 Unspecified atrial fibrillation: Secondary | ICD-10-CM

## 2018-06-10 DIAGNOSIS — I10 Essential (primary) hypertension: Secondary | ICD-10-CM | POA: Diagnosis not present

## 2018-06-10 DIAGNOSIS — Z1231 Encounter for screening mammogram for malignant neoplasm of breast: Secondary | ICD-10-CM

## 2018-06-10 DIAGNOSIS — Z0000 Encounter for general adult medical examination without abnormal findings: Secondary | ICD-10-CM

## 2018-06-10 DIAGNOSIS — N941 Unspecified dyspareunia: Secondary | ICD-10-CM

## 2018-06-10 DIAGNOSIS — Z23 Encounter for immunization: Secondary | ICD-10-CM

## 2018-06-10 DIAGNOSIS — E785 Hyperlipidemia, unspecified: Secondary | ICD-10-CM | POA: Diagnosis not present

## 2018-06-10 LAB — POCT URINALYSIS DIPSTICK
Bilirubin, UA: NEGATIVE
Blood, UA: NEGATIVE
Glucose, UA: NEGATIVE
Ketones, UA: NEGATIVE
Leukocytes, UA: NEGATIVE
Nitrite, UA: NEGATIVE
Protein, UA: NEGATIVE
Spec Grav, UA: <=1.005 — AB (ref 1.010–1.025)
Urobilinogen, UA: 0.2 E.U./dL
pH, UA: 7.5 (ref 5.0–8.0)

## 2018-06-10 MED ORDER — IRBESARTAN 150 MG PO TABS: 150.0000 mg | ORAL_TABLET | Freq: Every day | ORAL | 1 refills | Status: DC

## 2018-06-10 NOTE — Progress Notes (Signed)
Date:  06/10/2018   Name:  Wanda Moody   DOB:  [birth year]   MRN:  [id number]   Chief Complaint: Annual Exam (Breast Exam. ) Wanda Moody is a 63 y.o. female who presents today for her Complete Annual Exam. She feels well. She reports exercising some. She reports she is sleeping well. 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  Mammogram 02/2017 - ordered Colonoscopy 2016 Pap smear 2016 - normal with neg HPV  Hypertension  This is a chronic problem. The problem is controlled. Pertinent negatives include no chest pain, headaches, palpitations or shortness of breath. Past treatments include angiotensin blockers. The current treatment provides significant improvement.   [facility] [place]  She self converted after about 4 hours and it has not recurrent.  Labs were apparently normal and she was sent home.  She was advised to see a cardiologist.  So far, she has not had any further episodes.  Dyspareunia - she did not tolerate premarin vaginal cream due to increase in itching.  She still has some hot flashes but is most bothered by vaginal sx.  She has never tried Chilesphena.   Review of Systems  Constitutional: Negative for chills, fatigue and fever.  HENT: Negative for congestion, hearing loss, tinnitus, trouble swallowing and voice change.   Eyes: Negative for visual disturbance.  Respiratory: Negative for cough, chest tightness, shortness of breath and wheezing.   Cardiovascular: Negative for chest pain, palpitations and leg swelling.  Gastrointestinal: Negative for abdominal pain, constipation, diarrhea and vomiting.  Endocrine: Negative for polydipsia and polyuria.  Genitourinary: Negative for dysuria, frequency, genital sores, vaginal bleeding and vaginal discharge.  Musculoskeletal: Negative for arthralgias, gait problem and joint swelling.  Skin: Negative for color change and rash.  Neurological: Negative for dizziness, tremors,  light-headedness and headaches.  Hematological: Negative for adenopathy. Does not bruise/bleed easily.  Psychiatric/Behavioral: Negative for dysphoric mood and sleep disturbance. The patient is not nervous/anxious.     Patient Active Problem List   Diagnosis Date Noted  . Menopausal syndrome 06/04/2017  . Osteoarthritis of both thumbs 12/04/2016  . Right hip pain 10/05/2015  . Essential (primary) hypertension 02/03/2015  . Gastro-esophageal reflux disease without esophagitis 02/03/2015  . Mild hyperlipidemia 02/03/2015  . Cervical spinal stenosis 02/03/2015  . Benign essential tremor 02/03/2015    No Known Allergies  Past Surgical History:  Procedure Laterality Date  . KNEE ARTHROSCOPY      Social History   Tobacco Use  . Smoking status: Never Smoker  . Smokeless tobacco: Never Used  Substance Use Topics  . Alcohol use: Yes    Alcohol/week: 3.0 standard drinks    Types: 2 Standard drinks or equivalent, 1 Glasses of wine per week  . Drug use: Not on file     Medication list has been reviewed and updated.  Current Meds  Medication Sig  . aspirin 81 MG chewable tablet Chew by mouth daily.  . calcium carbonate (OS-CAL - DOSED IN MG OF ELEMENTAL CALCIUM) 1250 (500 Ca) MG tablet Take by mouth.  . Cholecalciferol (VITAMIN D) 2000 UNITS CAPS Take 1 capsule by mouth daily.  . fexofenadine (ALLEGRA) 60 MG tablet Take 60 mg by mouth 2 (two) times daily.  . irbesartan (AVAPRO) 150 MG tablet TAKE 1 TABLET BY MOUTH DAILY.  . MULTIPLE VITAMIN PO Take by mouth.  . Omega-3 1000 MG CAPS Take 1,000 mg by mouth daily.  . [DISCONTINUED] loratadine (CLARITIN)  10 MG tablet Take 10 mg by mouth daily.    PHQ 2/9 Scores 06/10/2018 06/04/2017 12/04/2016  PHQ - 2 Score 1 0 3  PHQ- 9 Score - - 6    BP Readings from Last 3 Encounters:  06/10/18 128/70  06/04/17 126/78  12/04/16 122/68    Physical Exam Vitals signs and nursing note reviewed.  Constitutional:      General: She is not  in acute distress.    Appearance: She is well-developed.  HENT:     Head: Normocephalic and atraumatic.     Right Ear: Tympanic membrane and ear canal normal.     Left Ear: Tympanic membrane and ear canal normal.     Nose:     Right Sinus: No maxillary sinus tenderness.     Left Sinus: No maxillary sinus tenderness.     Mouth/Throat:     Pharynx: Uvula midline.  Eyes:     General: No scleral icterus.       Right eye: No discharge.        Left eye: No discharge.     Conjunctiva/sclera: Conjunctivae normal.  Neck:     Musculoskeletal: Normal range of motion. No erythema.     Thyroid: No thyromegaly.     Vascular: No carotid bruit.  Cardiovascular:     Rate and Rhythm: Normal rate and regular rhythm.     Pulses: Normal pulses.     Heart sounds: Normal heart sounds.  Pulmonary:     Effort: Pulmonary effort is normal. No respiratory distress.     Breath sounds: No wheezing.  Chest:     Breasts:        Right: No mass, nipple discharge, skin change or tenderness.        Left: No mass, nipple discharge, skin change or tenderness.  Abdominal:     General: Bowel sounds are normal.     Palpations: Abdomen is soft.     Tenderness: There is no abdominal tenderness.  Musculoskeletal: Normal range of motion.  Lymphadenopathy:     Cervical: No cervical adenopathy.  Skin:    General: Skin is warm and dry.     Findings: No rash.  Neurological:     Mental Status: She is alert and oriented to person, place, and time.     Cranial Nerves: No cranial nerve deficit.     Sensory: No sensory deficit.     Deep Tendon Reflexes: Reflexes are normal and symmetric.  Psychiatric:        Speech: Speech normal.        Behavior: Behavior normal.        Thought Content: Thought content normal.     Wt Readings from Last 3 Encounters:  06/10/18 127 lb (57.6 kg)  06/04/17 123 lb (55.8 kg)  12/04/16 124 lb (56.2 kg)    BP 128/70   Pulse 98   Ht 5\' 5"  (1.651 m)   Wt 127 lb (57.6 kg)   SpO2 99%    BMI 21.13 kg/m   Assessment and Plan: 1. Annual physical exam Normal exam Continue healthy diet, exercise - POCT urinalysis dipstick  2. Encounter for screening mammogram for breast cancer To be scheduled  3. Essential (primary) hypertension controlled - irbesartan (AVAPRO) 150 MG tablet; Take 1 tablet (150 mg total) by mouth daily.  Dispense: 90 tablet; Refill: 1 - CBC with Differential/Platelet - Comprehensive metabolic panel - TSH  4. Mild hyperlipidemia On statin therapy - Lipid panel  5. Transient atrial  fibrillation (HCC) Single episode by history - if recurrent will refer to cardiology Continue aspirin daily  CHADS-VASC = 2  6. Need for shingles vaccine First dose today - Varicella-zoster vaccine IM  7. Dyspareunia, female Samples of Osphena 60 mg given   Partially dictated using Animal nutritionist. Any errors are unintentional.  Bari Edward, MD [facility]  06/10/2018

## 2018-06-11 LAB — LIPID PANEL
Chol/HDL Ratio: 3.5 ratio (ref 0.0–4.4)
Cholesterol, Total: 249 mg/dL — ABNORMAL HIGH (ref 100–199)
HDL: 72 mg/dL (ref >39)
LDL Calculated: 144 mg/dL — ABNORMAL HIGH (ref 0–99)
Triglycerides: 164 mg/dL — ABNORMAL HIGH (ref 0–149)
VLDL Cholesterol Cal: 33 mg/dL (ref 5–40)

## 2018-06-11 LAB — COMPREHENSIVE METABOLIC PANEL
ALT: 17 IU/L (ref 0–32)
AST: 22 IU/L (ref 0–40)
Albumin/Globulin Ratio: 2.9 — ABNORMAL HIGH (ref 1.2–2.2)
Albumin: 4.9 g/dL — ABNORMAL HIGH (ref 3.8–4.8)
Alkaline Phosphatase: 74 IU/L (ref 39–117)
BUN/Creatinine Ratio: 15 (ref 12–28)
BUN: 11 mg/dL (ref 8–27)
Bilirubin Total: 0.5 mg/dL (ref 0.0–1.2)
CO2: 23 mmol/L (ref 20–29)
Calcium: 10 mg/dL (ref 8.7–10.3)
Chloride: 97 mmol/L (ref 96–106)
Creatinine, Ser: 0.71 mg/dL (ref 0.57–1.00)
GFR calc Af Amer: 104 mL/min/{1.73_m2} (ref >59)
GFR calc non Af Amer: 90 mL/min/{1.73_m2} (ref >59)
Globulin, Total: 1.7 g/dL (ref 1.5–4.5)
Glucose: 94 mg/dL (ref 65–99)
Potassium: 4.9 mmol/L (ref 3.5–5.2)
Sodium: 142 mmol/L (ref 134–144)
Total Protein: 6.6 g/dL (ref 6.0–8.5)

## 2018-06-11 LAB — CBC WITH DIFFERENTIAL/PLATELET
Basophils Absolute: 0.1 10*3/uL (ref 0.0–0.2)
Basos: 2 %
EOS (ABSOLUTE): 0.2 10*3/uL (ref 0.0–0.4)
Eos: 5 %
Hematocrit: 37.9 % (ref 34.0–46.6)
Hemoglobin: 13.4 g/dL (ref 11.1–15.9)
Immature Grans (Abs): 0 10*3/uL (ref 0.0–0.1)
Immature Granulocytes: 0 %
Lymphocytes Absolute: 1 10*3/uL (ref 0.7–3.1)
Lymphs: 25 %
MCH: 32.2 pg (ref 26.6–33.0)
MCHC: 35.4 g/dL (ref 31.5–35.7)
MCV: 91 fL (ref 79–97)
Monocytes Absolute: 0.4 10*3/uL (ref 0.1–0.9)
Monocytes: 10 %
Neutrophils Absolute: 2.4 10*3/uL (ref 1.4–7.0)
Neutrophils: 58 %
Platelets: 247 10*3/uL (ref 150–450)
RBC: 4.16 x10E6/uL (ref 3.77–5.28)
RDW: 12.8 % (ref 11.7–15.4)
WBC: 4.1 10*3/uL (ref 3.4–10.8)

## 2018-06-11 LAB — TSH: TSH: 1.57 u[IU]/mL (ref 0.450–4.500)

## 2018-09-13 ENCOUNTER — Other Ambulatory Visit: Payer: Self-pay

## 2018-09-13 ENCOUNTER — Ambulatory Visit (INDEPENDENT_AMBULATORY_CARE_PROVIDER_SITE_OTHER): Payer: 59

## 2018-09-13 DIAGNOSIS — Z23 Encounter for immunization: Secondary | ICD-10-CM | POA: Diagnosis not present

## 2018-09-27 ENCOUNTER — Other Ambulatory Visit: Payer: Self-pay

## 2018-09-27 ENCOUNTER — Ambulatory Visit
Admission: RE | Admit: 2018-09-27 | Discharge: 2018-09-27 | Disposition: A | Discharge status: Home / Self Care | Payer: 59 | Source: Ambulatory Visit | Attending: Internal Medicine | Admitting: Internal Medicine

## 2018-09-27 DIAGNOSIS — Z1231 Encounter for screening mammogram for malignant neoplasm of breast: Secondary | ICD-10-CM | POA: Insufficient documentation

## 2018-10-11 IMAGING — MG MM DIGITAL SCREENING BILAT W/ TOMO W/ CAD
8 of 12 series · 8 of 28 positions shown · non-contrast
Comparison: Previous exam(s).

CLINICAL DATA: Screening.

EXAM:
2D DIGITAL SCREENING BILATERAL MAMMOGRAM WITH CAD AND ADJUNCT TOMO

[R MLO synth-2D]
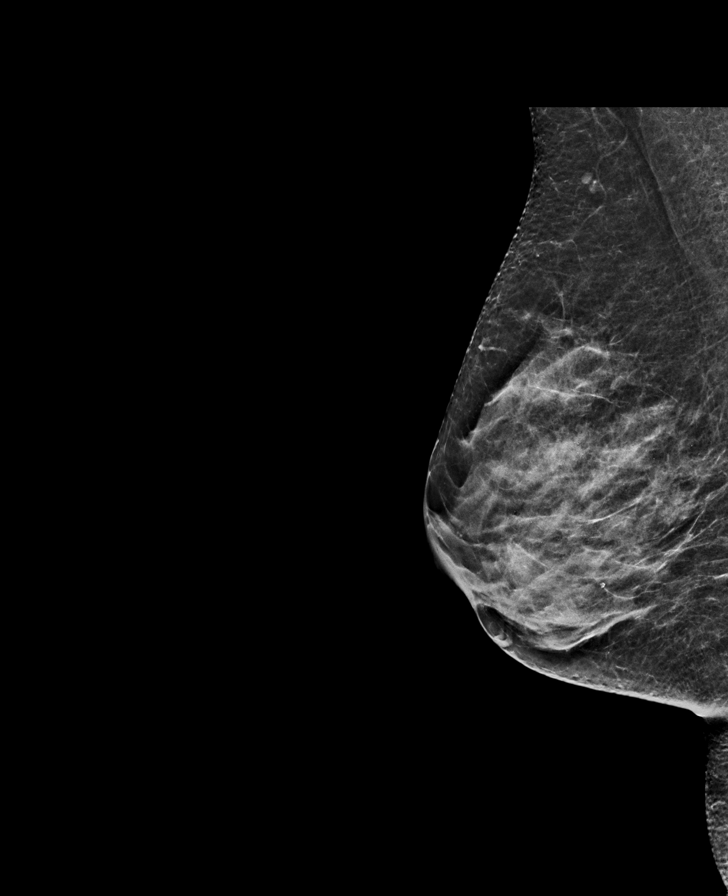

[L MLO]
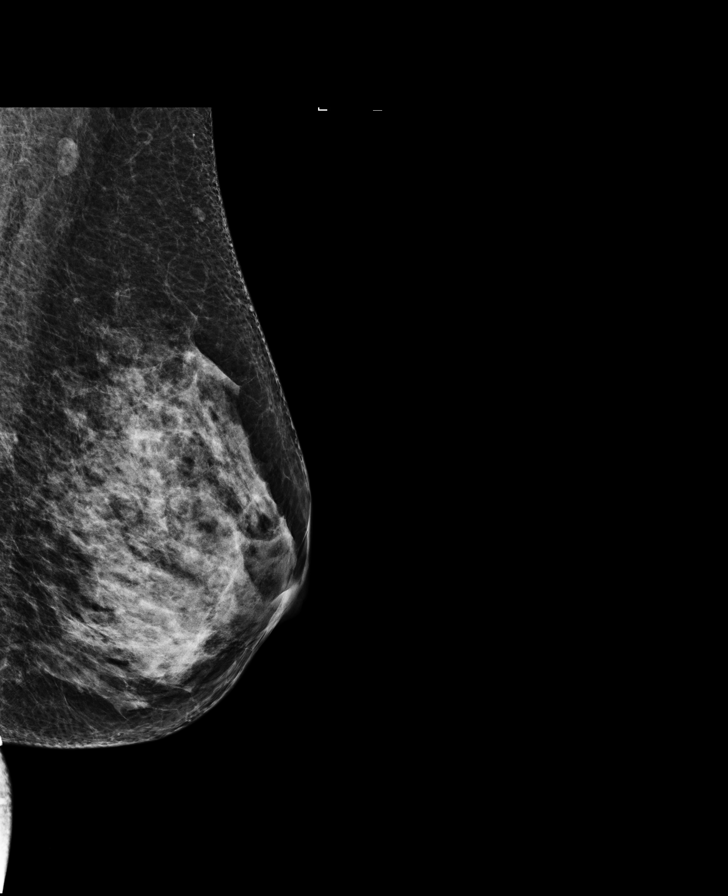

[L CC synth-2D]
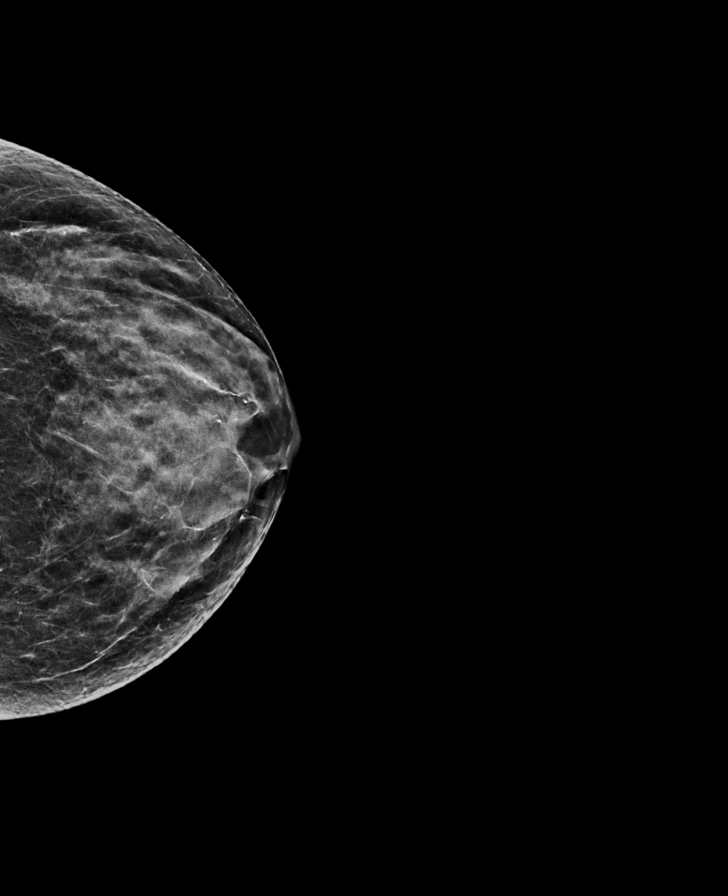

[L CC]
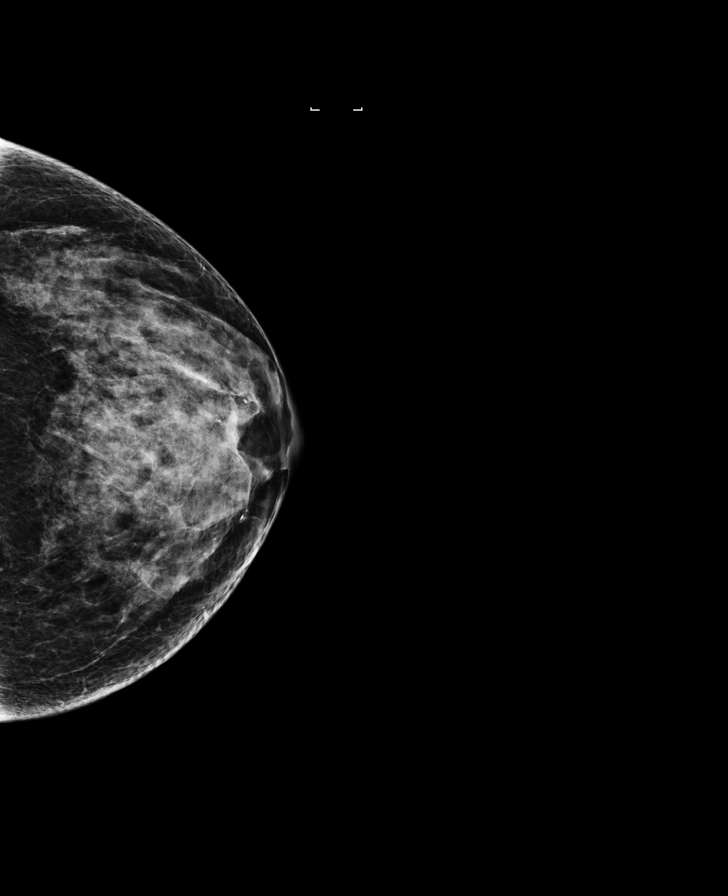

[R MLO]
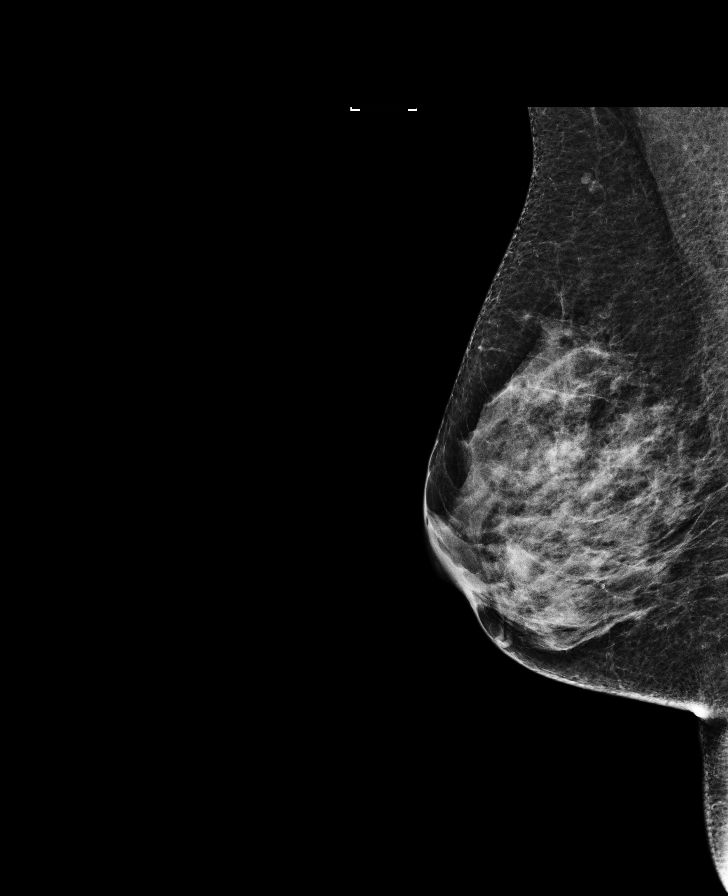

[L MLO synth-2D]
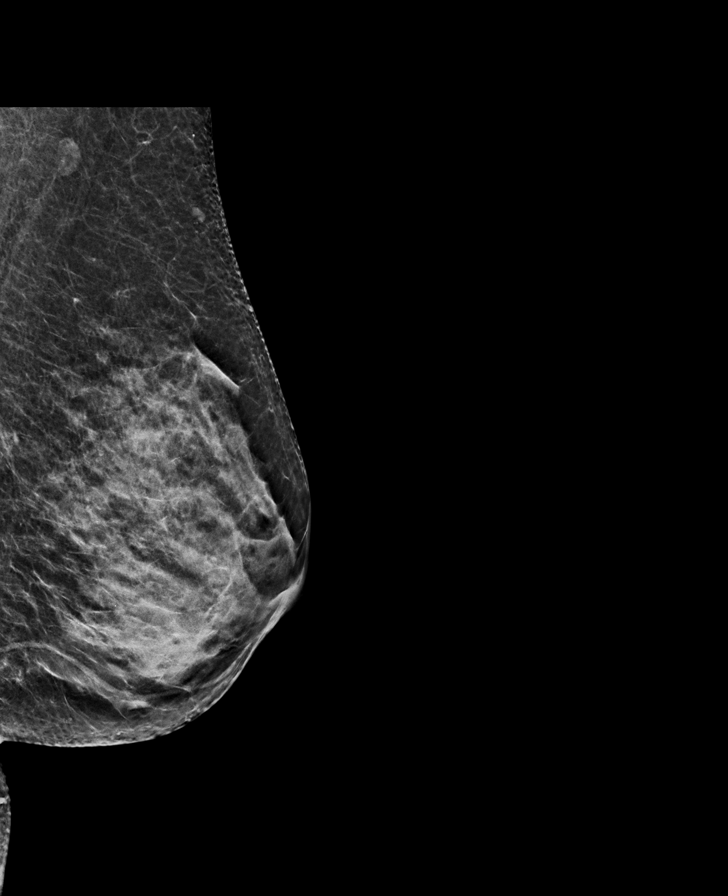

[R CC]
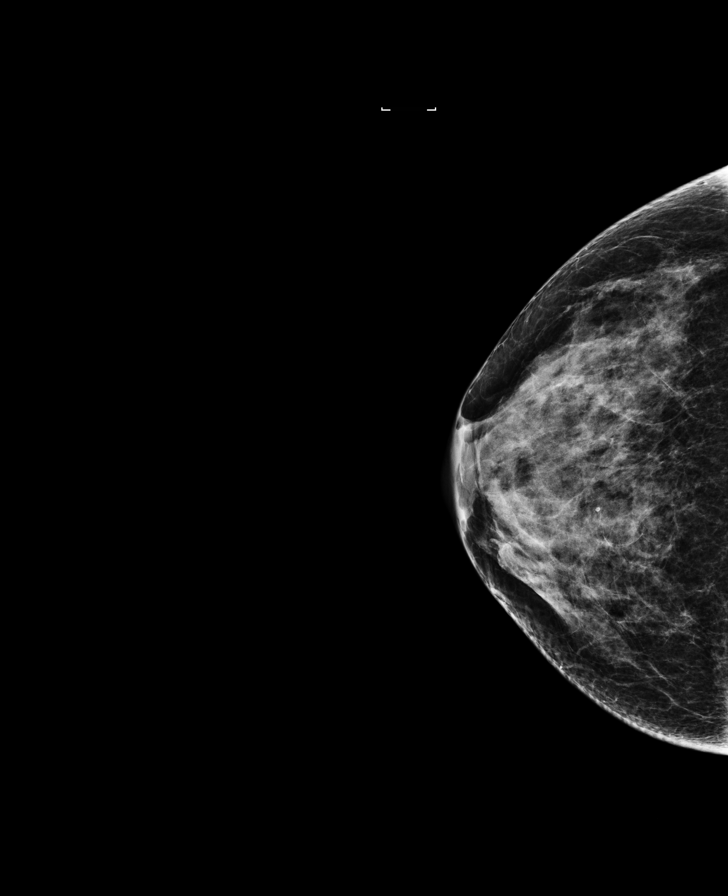

[R CC synth-2D]
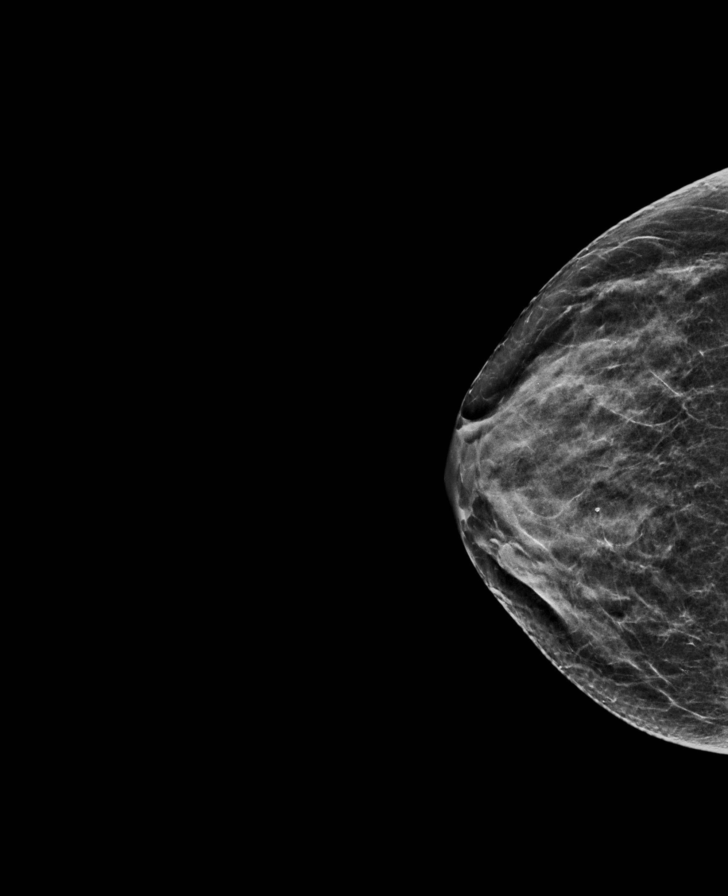

[8 of 28 positions shown; findings below may reference images not displayed]

ACR Breast Density Category c: The breast tissue is heterogeneously
dense, which may obscure small masses.
FINDINGS: There are no findings suspicious for malignancy. Images were
processed with CAD.
IMPRESSION: No mammographic evidence of malignancy. A result letter of this
screening mammogram will be mailed directly to the patient.

RECOMMENDATION:
Screening mammogram in one year. (Code:TN-[id number])

BI-RADS CATEGORY  1: Negative.

## 2018-10-19 ENCOUNTER — Other Ambulatory Visit: Payer: Self-pay | Admitting: Internal Medicine

## 2018-10-19 DIAGNOSIS — I10 Essential (primary) hypertension: Secondary | ICD-10-CM

## 2019-01-31 ENCOUNTER — Other Ambulatory Visit: Payer: Self-pay

## 2019-01-31 DIAGNOSIS — I10 Essential (primary) hypertension: Secondary | ICD-10-CM

## 2019-01-31 MED ORDER — IRBESARTAN 150 MG PO TABS: 150.0000 mg | ORAL_TABLET | Freq: Every day | ORAL | 1 refills | Status: AC

## 2019-06-13 ENCOUNTER — Encounter: Payer: 59 | Admitting: Internal Medicine
